# Patient Record
Sex: Female | Born: 1985 | Race: White | Hispanic: No | Marital: Single | Smoking: Current every day smoker
Health system: Southern US, Community
[De-identification: ages and names within clinical notes are randomized; demographics above are authoritative.]

---
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
## 2006-01-17 ENCOUNTER — Emergency Department (HOSPITAL_COMMUNITY): Admission: EM | Admit: 2006-01-17 | Discharge: 2006-01-17 | Payer: Self-pay

## 2006-01-23 ENCOUNTER — Emergency Department (HOSPITAL_COMMUNITY): Admission: EM | Admit: 2006-01-23 | Discharge: 2006-01-23 | Payer: Self-pay | Admitting: Emergency Medicine

## 2006-01-30 ENCOUNTER — Emergency Department (HOSPITAL_COMMUNITY): Admission: EM | Admit: 2006-01-30 | Discharge: 2006-01-30 | Payer: Self-pay | Admitting: Emergency Medicine

## 2006-02-11 ENCOUNTER — Emergency Department (HOSPITAL_COMMUNITY): Admission: EM | Admit: 2006-02-11 | Discharge: 2006-02-11 | Payer: Self-pay | Admitting: Emergency Medicine

## 2006-04-07 ENCOUNTER — Emergency Department (HOSPITAL_COMMUNITY): Admission: EM | Admit: 2006-04-07 | Discharge: 2006-04-07 | Payer: Self-pay | Admitting: Emergency Medicine

## 2006-04-19 ENCOUNTER — Emergency Department (HOSPITAL_COMMUNITY): Admission: EM | Admit: 2006-04-19 | Discharge: 2006-04-19 | Payer: Self-pay | Admitting: Emergency Medicine

## 2006-04-20 ENCOUNTER — Emergency Department (HOSPITAL_COMMUNITY): Admission: EM | Admit: 2006-04-20 | Discharge: 2006-04-20 | Payer: Self-pay | Admitting: Emergency Medicine

## 2006-07-09 ENCOUNTER — Emergency Department (HOSPITAL_COMMUNITY): Admission: EM | Admit: 2006-07-09 | Discharge: 2006-07-09 | Payer: Self-pay | Admitting: *Deleted

## 2006-08-16 ENCOUNTER — Emergency Department (HOSPITAL_COMMUNITY): Admission: EM | Admit: 2006-08-16 | Discharge: 2006-08-16 | Payer: Self-pay | Admitting: Emergency Medicine

## 2006-08-25 ENCOUNTER — Emergency Department (HOSPITAL_COMMUNITY): Admission: EM | Admit: 2006-08-25 | Discharge: 2006-08-25 | Payer: Self-pay | Admitting: Emergency Medicine

## 2006-12-03 ENCOUNTER — Inpatient Hospital Stay (HOSPITAL_COMMUNITY): Admission: AD | Admit: 2006-12-03 | Discharge: 2006-12-03 | Payer: Self-pay | Admitting: Obstetrics & Gynecology

## 2006-12-20 ENCOUNTER — Ambulatory Visit: Payer: Self-pay | Admitting: Obstetrics and Gynecology

## 2007-01-22 ENCOUNTER — Emergency Department (HOSPITAL_COMMUNITY): Admission: EM | Admit: 2007-01-22 | Discharge: 2007-01-22 | Payer: Self-pay | Admitting: *Deleted

## 2007-01-23 ENCOUNTER — Inpatient Hospital Stay (HOSPITAL_COMMUNITY): Admission: AD | Admit: 2007-01-23 | Discharge: 2007-01-23 | Payer: Self-pay | Admitting: Gynecology

## 2007-01-25 ENCOUNTER — Inpatient Hospital Stay (HOSPITAL_COMMUNITY): Admission: AD | Admit: 2007-01-25 | Discharge: 2007-01-25 | Payer: Self-pay | Admitting: Obstetrics & Gynecology

## 2007-01-28 ENCOUNTER — Emergency Department (HOSPITAL_COMMUNITY): Admission: EM | Admit: 2007-01-28 | Discharge: 2007-01-28 | Payer: Self-pay | Admitting: Emergency Medicine

## 2007-02-25 ENCOUNTER — Emergency Department (HOSPITAL_COMMUNITY): Admission: EM | Admit: 2007-02-25 | Discharge: 2007-02-25 | Payer: Self-pay | Admitting: Emergency Medicine

## 2007-03-07 ENCOUNTER — Emergency Department (HOSPITAL_COMMUNITY): Admission: EM | Admit: 2007-03-07 | Discharge: 2007-03-07 | Payer: Self-pay | Admitting: *Deleted

## 2007-03-07 ENCOUNTER — Ambulatory Visit: Payer: Self-pay | Admitting: Obstetrics & Gynecology

## 2007-04-26 ENCOUNTER — Ambulatory Visit: Payer: Self-pay | Admitting: Gynecology

## 2007-05-22 ENCOUNTER — Ambulatory Visit (HOSPITAL_COMMUNITY): Admission: RE | Admit: 2007-05-22 | Discharge: 2007-05-22 | Payer: Self-pay | Admitting: Gynecology

## 2007-05-22 ENCOUNTER — Ambulatory Visit: Payer: Self-pay | Admitting: Gynecology

## 2007-05-23 ENCOUNTER — Inpatient Hospital Stay (HOSPITAL_COMMUNITY): Admission: RE | Admit: 2007-05-23 | Discharge: 2007-05-23 | Payer: Self-pay | Admitting: Obstetrics & Gynecology

## 2007-05-23 ENCOUNTER — Ambulatory Visit: Payer: Self-pay | Admitting: Physician Assistant

## 2007-06-13 ENCOUNTER — Ambulatory Visit: Payer: Self-pay | Admitting: Obstetrics & Gynecology

## 2007-07-30 ENCOUNTER — Emergency Department (HOSPITAL_COMMUNITY): Admission: EM | Admit: 2007-07-30 | Discharge: 2007-07-30 | Payer: Self-pay | Admitting: Emergency Medicine

## 2007-08-02 ENCOUNTER — Ambulatory Visit: Payer: Self-pay | Admitting: Obstetrics & Gynecology

## 2007-10-07 ENCOUNTER — Emergency Department (HOSPITAL_COMMUNITY): Admission: EM | Admit: 2007-10-07 | Discharge: 2007-10-07 | Payer: Self-pay | Admitting: Emergency Medicine

## 2007-10-15 ENCOUNTER — Inpatient Hospital Stay (HOSPITAL_COMMUNITY): Admission: RE | Admit: 2007-10-15 | Discharge: 2007-10-15 | Payer: Self-pay | Admitting: Family Medicine

## 2007-10-15 ENCOUNTER — Ambulatory Visit: Payer: Self-pay | Admitting: Family Medicine

## 2007-11-14 ENCOUNTER — Inpatient Hospital Stay (HOSPITAL_COMMUNITY): Admission: AD | Admit: 2007-11-14 | Discharge: 2007-11-14 | Payer: Self-pay | Admitting: Obstetrics and Gynecology

## 2008-01-07 ENCOUNTER — Emergency Department (HOSPITAL_COMMUNITY): Admission: EM | Admit: 2008-01-07 | Discharge: 2008-01-07 | Payer: Self-pay | Admitting: Emergency Medicine

## 2008-02-17 ENCOUNTER — Inpatient Hospital Stay (HOSPITAL_COMMUNITY): Admission: AD | Admit: 2008-02-17 | Discharge: 2008-02-17 | Payer: Self-pay | Admitting: Obstetrics and Gynecology

## 2008-06-01 ENCOUNTER — Inpatient Hospital Stay (HOSPITAL_COMMUNITY): Admission: AD | Admit: 2008-06-01 | Discharge: 2008-06-01 | Payer: Self-pay | Admitting: Obstetrics & Gynecology

## 2008-08-17 ENCOUNTER — Emergency Department (HOSPITAL_COMMUNITY): Admission: EM | Admit: 2008-08-17 | Discharge: 2008-08-18 | Payer: Self-pay | Admitting: Emergency Medicine

## 2008-09-04 ENCOUNTER — Ambulatory Visit: Payer: Self-pay | Admitting: Obstetrics and Gynecology
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
## 2009-01-11 ENCOUNTER — Inpatient Hospital Stay (HOSPITAL_COMMUNITY): Admission: AD | Admit: 2009-01-11 | Discharge: 2009-01-11 | Payer: Self-pay | Admitting: Obstetrics & Gynecology

## 2010-04-11 ENCOUNTER — Encounter: Payer: Self-pay | Admitting: Pediatrics

## 2010-06-24 LAB — WET PREP, GENITAL
Trich, Wet Prep: NONE SEEN
Yeast Wet Prep HPF POC: NONE SEEN

## 2010-06-24 LAB — POCT PREGNANCY, URINE: Preg Test, Ur: NEGATIVE

## 2010-06-29 LAB — RAPID URINE DRUG SCREEN, HOSP PERFORMED
Cocaine: NOT DETECTED
Opiates: NOT DETECTED

## 2010-06-29 LAB — POCT I-STAT, CHEM 8
BUN: 9 mg/dL (ref 6–23)
Chloride: 108 mEq/L (ref 96–112)
Creatinine, Ser: 0.9 mg/dL (ref 0.4–1.2)
Glucose, Bld: 95 mg/dL (ref 70–99)
Hemoglobin: 13.9 g/dL (ref 12.0–15.0)
Potassium: 3.5 mEq/L (ref 3.5–5.1)
Sodium: 140 mEq/L (ref 135–145)

## 2010-06-29 LAB — DIFFERENTIAL
Eosinophils Absolute: 0.1 10*3/uL (ref 0.0–0.7)
Lymphocytes Relative: 27 % (ref 12–46)
Lymphs Abs: 1.9 10*3/uL (ref 0.7–4.0)
Monocytes Relative: 5 % (ref 3–12)
Neutrophils Relative %: 66 % (ref 43–77)

## 2010-06-29 LAB — URINALYSIS, ROUTINE W REFLEX MICROSCOPIC
Bilirubin Urine: NEGATIVE
Glucose, UA: NEGATIVE mg/dL
Ketones, ur: 15 mg/dL — AB
Specific Gravity, Urine: 1.02 (ref 1.005–1.030)
pH: 7.5 (ref 5.0–8.0)

## 2010-06-29 LAB — ACETAMINOPHEN LEVEL: Acetaminophen (Tylenol), Serum: <10 ug/mL — ABNORMAL LOW (ref 10–30)

## 2010-06-29 LAB — POCT PREGNANCY, URINE: Preg Test, Ur: NEGATIVE

## 2010-06-29 LAB — CBC
HCT: 38.9 % (ref 36.0–46.0)
MCV: 90.7 fL (ref 78.0–100.0)
Platelets: 229 10*3/uL (ref 150–400)
RBC: 4.29 MIL/uL (ref 3.87–5.11)
WBC: 6.9 10*3/uL (ref 4.0–10.5)

## 2010-06-29 LAB — URINE MICROSCOPIC-ADD ON

## 2010-06-29 LAB — ETHANOL: Alcohol, Ethyl (B): <5 mg/dL (ref 0–10)

## 2010-07-01 LAB — WET PREP, GENITAL
Clue Cells Wet Prep HPF POC: NONE SEEN
Trich, Wet Prep: NONE SEEN

## 2010-07-01 LAB — GC/CHLAMYDIA PROBE AMP, GENITAL
Chlamydia, DNA Probe: NEGATIVE
GC Probe Amp, Genital: NEGATIVE

## 2010-08-03 NOTE — Op Note (Signed)
Veronica Brewer, Veronica Brewer NO.:  [id number]   MEDICAL RECORD NO.:  [id number]          PATIENT TYPE:  AMB   LOCATION:  SDC                           [facility]  [facility]   PHYSICIAN:  Ginger Carne, MD  DATE OF BIRTH:  06-01-1985   DATE OF PROCEDURE:  05/22/2007  DATE OF DISCHARGE:                               OPERATIVE REPORT   PREOPERATIVE DIAGNOSIS:  Chronic pelvic pain.   POSTOPERATIVE DIAGNOSIS:  Stage I endometriosis.   PROCEDURE:  Laparoscopic cauterization of pelvic endometriosis.   SURGEON:  Ginger Carne, M.D.   ASSISTANT:  None.   COMPLICATIONS:  None immediate.   ESTIMATED BLOOD LOSS:  Minimal.   SPECIMENS:  None.   OPERATIVE FINDINGS:  External genitalia, vulva and vagina normal.  Cervix smooth without erosions or lesions.  Laparoscopic evaluation  demonstrated evidence of stage I endometriosis with multiple red  punctate lesions in the broad ligaments, the left and right uterosacral  ligaments, and the surface of the left ovary.  Both tubes appeared  normal.  There were also clear punctate lesions bilaterally on the broad  ligaments.  Uterus was normal size and contour.  Appendix was normal.  No evidence of femoral, inguinal, or obturator hernias.  No evidence of  adhesive disease in the pelvis.  No evidence of Fitz-Hugh-Curtis  syndrome.  Large and small bowel grossly normal.  Anterior bladder flap  normal.   DESCRIPTION OF PROCEDURE:  The patient was prepped and draped in the  usual sterile fashion and placed in the lithotomy position.  Betadine  solution used for antiseptic and the patient was catheterized prior to  the procedure.  After adequate general anesthesia, the tenaculum was  placed on the anterior lip of the cervix and a Hickling tenaculum placed  in the endocervical canal.  Afterward a vertical infraumbilical incision  was made and the Veress needle placed in the abdomen.  Opening and  closing pressures were 10-15 mmHg.   Needle released, trocar placed  through same incision, and laparoscope placed through the trocar sleeve.  A 5 mm port was made in the left lower quadrant under direct  visualization.  Inspection of the pelvic contents was carried out.  Following this cauterization with bipolar cautery at 40 watts along the  inner aspect of the uterosacral ligaments with the ureters in view  bilaterally was performed.  Areas in the cul-de-sac were also  cauterized.  The remainder could not be done due to concerns regarding  the ureters position.  The lesion on the surface of the left ovary was  also cauterized.  Afterward  gas was released and trocars removed.  Closure of the 10 mm fascia site  with 0 Vicryl suture and 4-0 Vicryl for subcuticular closure.  Needle,  sponge, and instrument counts correct.  The patient tolerated the  procedure well and returned to the postanesthesia care unit in excellent  condition.      Ginger Carne, MD  Electronically Signed     SHB/MEDQ  D:  05/22/2007  T:  05/22/2007  Job:  [id number]

## 2010-08-03 NOTE — Group Therapy Note (Signed)
NAME:  REID, NAWROT NO.:  [id number]   MEDICAL RECORD NO.:  [id number]          PATIENT TYPE:  WOC   LOCATION:  [facility]                   FACILITY:  [facility]   PHYSICIAN:  Argentina Donovan, MD        DATE OF BIRTH:  1986/02/05   DATE OF SERVICE:                                  CLINIC NOTE   The patient is a 25 year old Caucasian female nulligravida who has been  seen multiple times in multiple emergency rooms for midcycle pain,  ruptured ovarian cysts.  She complains of chronic pain all the time on  and off, although she did well when she was on Provera the first time.  She is very bad with taking pills she says, but she bled all the second  time she was on it, but since she has been off she has had more pain.  I  have offered that the ultrasound was normal except for some two small  follicular cysts.  I have told her we could give her another try on  that.  I have told her the options of the patch and the IUD and the  ring.  She seems to want to try the Depo-Provera again.  I told her if  she starts bleeding we can give it every two months instead of every  three and see if that takes care of her.  She is a slender young lady.  Has not had any problem with weight gain when she was on it.  She weighs  107 pounds, 5 feet 5 inches, and works at the [employer] at [employer].  Her Pap smear last was at the beginning of this year, and she will not  be due  for another one until after the first of the year.  Her  examination has been generally normal.  In the MAU visit in here we  discussed it was not necessary to put her through that again.  All her  labs have been fine.   IMPRESSION:  1. Dysmenorrhea.  2. Recurrent ovarian cysts.           ______________________________  Argentina Donovan, MD     PR/MEDQ  D:  12/20/2006  T:  12/21/2006  Job:  [id number]

## 2010-08-03 NOTE — Group Therapy Note (Signed)
NAME:  Veronica Brewer, Veronica Brewer NO.:  [id number]   MEDICAL RECORD NO.:  [id number]          PATIENT TYPE:  WOC   LOCATION:  [facility]                   FACILITY:  [facility]   PHYSICIAN:  Elsie Lincoln, MD      DATE OF BIRTH:  [birth year]   DATE OF SERVICE:                                  CLINIC NOTE   The patient is a 25 year old female who had surgery with Dr. Mia Creek  approximately 2 weeks ago.  On May 22, 2007, the patient had chronic  pelvic pain and was found to have Stage I endometriosis.  He did some  cauterizing of the implants, but was not able to get all of it per the  patient.  Postoperatively the patient continues to have pelvic pain  which she states is severe.  She takes occasional Vicodin but it causes  her nausea.  She wants to get pregnant so she is a  little leery to go  on birth control.  However, she has no insurance and is in a same sex  couple, so I told her that she should probably obtain insurance and  maybe go through IUI once she has enough money and a stable home  situation to support a child.  She did agree to go on Yaz continuous so  we gave her 2 packs and we will have her come back in 3 months to see  how she is doing on that.  A  prescription for Dianah Field was also given.  She  was also given a prescription for Tramadol for pain.  Post surgically,  her incisions are well healed and I did cut portions of the knots out.  The patient is coming back in 3 months for reevaluation.           ______________________________  Elsie Lincoln, MD     KL/MEDQ  D:  06/13/2007  T:  06/13/2007  Job:  [id number]

## 2010-08-06 NOTE — H&P (Signed)
NAMELOGHAN, SUBIA                ACCOUNT NO.:  [id number]   MEDICAL RECORD NO.:  [id number]          PATIENT TYPE:  INP   LOCATION:  0152                         FACILITY:  [facility]   PHYSICIAN:  Melissa L. Ladona Ridgel, MD  DATE OF BIRTH:  [birth year]   DATE OF ADMISSION:  03/04/2005  DATE OF DISCHARGE:                                HISTORY & PHYSICAL   CHIEF COMPLAINT:  Body aches, headache, urinary tract infection.   PRIMARY CARE PHYSICIAN:  Unassigned.   HISTORY OF PRESENT ILLNESS:  The patient is an 25 year old white female who  states that Wednesday night she worked up acutely ill with body aches,  burning up with fever, shivering.  She says she stayed in bed and awoke in  the morning and could not breath, so she had her friend bring her to the  emergency room at [facility].  In the emergency room, she was  evaluated and treated with IV fluids, pain medications, and antibiotics.  She was discharged to home with a prescription for pain medication, nausea  medication, Pyridium, and oral Cipro.  The patient states that today she  took one dose of each but still continued to have dizziness, headache.  Her  friends reported that she was acting strangely and may have had some passing  out spells.  The friends brought her to the emergency room for further  evaluation and we have been asked to admit her for a urinary tract infection  with likely sepsis.   PAST MEDICAL HISTORY:  1.  Asthma.  She has never been intubated for this and controls her disease      only with p.r.n. albuterol.  2.  Panic attacks, diagnosed three weeks ago after she presented to the      urgent care center.  She was started on Paxil at that time and was to      followup with an appointment with [facility].   PAST SURGICAL HISTORY:  Tonsillectomy.   SOCIAL HISTORY:  She denies tobacco, ethanol.  Currently she is unemployed  and she denies illicit drug use.   FAMILY HISTORY:  Dad  is living with renal problems of unclear etiology.  Mom  is living with unknown medical illness.  Her grandmother is whom she stays  with and she has diabetes.   REVIEW OF SYSTEMS:  She did have a little bit of diarrhea, positive nausea  but no vomiting.  She has not been eating very well.  She has been dizzy.  She has a headache.  She continues to have body aches and chills.  She  states that she does have some dysuria.  All other review of systems are  negative with the exception of some mild back pain which has come and gone.   ALLERGIES:  CODEINE.   MEDICATIONS:  1.  Albuterol p.r.n.  2.  Paxil, she takes the pink one, milligrams are unknown.   PHYSICAL EXAMINATION:  VITAL SIGNS:  Temperature is 101.4, blood pressure  82/84, pulse was 128, respirations 18, saturation 99%.  GENERAL:  This is  a very ill-appearing white female.  HEENT:  She is normocephalic, atraumatic.  Pupils are equal, round, and  reactive to light.  Extraocular muscles are intact.  Her mucous membranes  are dry with dry perioral skin and lips.  Her left eye is puffy but she has  no conjunctivitis.  NECK:  Supple.  There is no JVD.  CHEST:  Decreased breath sounds bilaterally.  No rhonchi, rales, or wheezes.  Her breath sounds are distant.  CARDIOVASCULAR:  Tachycardic.  Positive S1 S2.  No S3, S4.  She has very  distant heart sounds.  ABDOMEN:  Soft, mildly tender in the right flank.  No guarding or rebound.  No hepatosplenomegaly.  EXTREMITIES:  There is no clubbing, cyanosis, or edema.  NEUROLOGIC:  She is awake, alert, and oriented x3.  Cranial nerves II-XII  appear to be intact.  She is obviously fatigued.  Power is 4/5 bilaterally  and symmetrical.   LABORATORY:  Reveal a urinalysis with large leukocyte esterase, positive  nitrites, greater than 30 of total protein, positive ketones.  Her white  count is 21.2, hemoglobin 12.3, hematocrit 35.6, platelets 150.  Her sodium  is 133, potassium 3.4, chloride  is 107, CO2 is 21, BUN is 10, creatinine is  12, glucose is 124, calcium is 7.  Urine pregnancy is negative.   ASSESSMENT:  This is an 25 year old white female seen last evening at the  [facility] Emergency Room for shortness of breath, chills, weakness,  diagnosed with a urinary tract infection and sent home with oral  medications.  The patient returns with continued chills, fever, weakness,  possible syncopal episode.  She will be admitted to the stepdown for  treatment of a urinary tract infection and sepsis with hypotension and  tachycardia.   PLAN:  1.  GU.  UTI probable sepsis.  We will aggressively volume replete her.      Continue her on IV Cipro.  Followup on culture from last evening and      have some blood cultures and urine cultures again today.  If she remains      volume unresponsive, then we will need to be more aggressive with      possible pressors.  If she deteriorates, I will change her antibiotics      to a more aggressive coverage.  I would like to check a renal ultrasound      tonight to rule out possible hydronephrosis and place a Foley for      monitoring of her urine output to prevent her from synopsizing from      orthostasis when rising.  We will continue aggressive volume      resuscitation.  2.  GI.  Positive nausea.  We will symptomatically treat her with Zofran,      and we will start her on Protonix.  3.  Endocrine.  She has got mild hyperglycemia.  We will monitor her CBGs      q.6h. with modified sliding scale insulin for coverage only greater than      in the 200s.  4.  Cardiovascular.  Tachycardia.  She has only received 1 liter of fluids.      If she remains unresponsive to volume repletion with regard to her blood      pressure or her urine output decreases, we may need to add pressors.  5.  Pulmonary.  She has a history of asthma.  Yesterday she was short of      breath.  I will check a chest x-ray.  I will continue her albuterol     p.r.n. and  add a flutter valve.  6.  DVT prophylaxis will be with Lovenox 40 mg subcutaneously every day.  7.  Hypocalcemia.  I will check an albumin and calculate a corrected      calcium.  If it is low, we will check an ionized calcium and replete.  8.  Neurology.  She has a history of passing out.  This is probably      secondary to orthostasis.  I would like to keep her in bed until she is      volume replenished; therefore, a Foley will be placed.  At this time,      she is neurologically appropriate,.  9.  ID.  We will follow up her blood cultures, urine cultures, and adjust      the antibiotics according to her re-evaluation.  For now, I will      continue the Cipro.  But if she deteriorates, we will add more      aggressive antibiotic      therapy.  10. Psych.  For her panic attacks, for now hold the Paxil.  We will try to      identify the dosage.  The patient will need appropriate outpatient      followup since being started recently on this Paxil.      Melissa L. Ladona Ridgel, MD  Electronically Signed     MLT/MEDQ  D:  03/04/2005  T:  03/04/2005  Job:  [id number]

## 2010-08-06 NOTE — Discharge Summary (Signed)
Veronica Brewer, Veronica Brewer NO.:  [id number]   MEDICAL RECORD NO.:  [id number]          PATIENT TYPE:  INP   LOCATION:  0152                         FACILITY:  [facility]   PHYSICIAN:  Theone Stanley, MD   DATE OF BIRTH:  29-Sep-1985   DATE OF ADMISSION:  03/04/2005  DATE OF DISCHARGE:  03/07/2005                                 DISCHARGE SUMMARY   ADMISSION DIAGNOSES:  1.  Urinary tract infection with sepsis.  2.  History of asthma.  3.  Anxiety disorder.   DISCHARGE DIAGNOSES:  1.  Urinary tract infection with sepsis.  2.  History of asthma.  3.  Anxiety disorder.  4.  Herpes labialis.   CONSULTATIONS:  Critical Care Management.   PROCEDURES AND DIAGNOSTIC TESTS:  None.   HOSPITAL COURSE:  [name] is a very anxious 25 year old female who  presented with continued problems with not feeling well overall for the past  week. She had recently been in the ER and she was discharged on Cipro since  it was found that she had a urinary tract infection. However, because she  continued to feel bad, she returned to the ER and at that time it was noted  that her pressures were in the 80s in the 70s. She was admitted to the ICU  at that point in time. Critical Care was consulted and the patient had  refused a central line. She received several boluses, her pressures came up.  She was placed on IV Cipro, she improved on a daily basis and in addition  since there is a history of kidney disease in the family, a 24 hour urine  was obtained. These results are still pending. During her stay, it was noted  that she had herpes labialis, she was given Valtrex on top of that. Also of  note, her labia majora was red. Examination of her vaginal area revealed  evidence of yeast infection. She was given a dose of Diflucan. In addition,  she had discharge so chlamydia and gonorrhea cultures were sent. The results  are pending. Overall, the patient improved over the next few days. Her  pressures came up into the 120s to 130s. Her heart rate came down into the  80s. She feeling well enough that she was able to ambulate. She was eating,  she was discharged in stable condition.   DISCHARGE MEDICATIONS:  1.  Ciprofloxacin 500 mg 1 p.o. b.i.d. for an additional 11 days.  2.  Paxil at her home dosage.  3.  She was given a small amount of Ativan until she sees her primary care      physician.   FOLLOW UP:  The patient is to followup with [facility].      Theone Stanley, MD  Electronically Signed     AEJ/MEDQ  D:  03/07/2005  T:  03/09/2005  Job:  [id number]   cc:   [facility]

## 2010-08-06 NOTE — Consult Note (Signed)
Veronica Brewer, POINTER                ACCOUNT NO.:  [id number]   MEDICAL RECORD NO.:  [id number]          PATIENT TYPE:  INP   LOCATION:  0152                         FACILITY:  [facility]   PHYSICIAN:  Coralyn Helling, M.D.      DATE OF BIRTH:  [birth year]   DATE OF CONSULTATION:  03/04/2005  DATE OF DISCHARGE:  03/07/2005                                   CONSULTATION   REFERRING PHYSICIAN:  Dr. Derenda Mis   REASON FOR CONSULTATION:  Rule out sepsis.   This is an 25 year old female, who was admitted earlier in the day after  developing diffuse body aches with fever and dysuria.  She says that her  symptoms started approximately 2 days ago when she woke up in the middle of  the night with fever and shivering and then the body aches.  She had come to  the emergency room actually initially back on December 10 with vomiting, and  she was given promethazine for this and discharged home.  She also was  complaining of some tingling feelings in her hand.  She came back to the  emergency room on December 14 for these complaints of fever and muscle ache.  She was given Cipro and Pyridium and discharged home; however, her symptoms  continued to progress.  Of note is that in prior documentations and previous  ER visits, her blood pressures have been in the 120s/80s.  Today her blood  pressure has been anywhere from 80-90 systolic, and she has had a sinus  tachycardia with a heart rate of 130-140.  She has had some headache but  denies neck stiffness.  There is no blurred vision.  She denies thyroid  disease.  She does have a history of asthma but only uses her inhalers  sparingly.  She denies any symptoms of chest pain or shortness of breath,  although she has had a cough with clear to yellow-colored sputum since  today.  She denies any skin rashes or joint tenderness or swelling.  She  says she thinks she has lost weight, although she is not sure how much.  She  denies abdominal pain, although she  complains of having pain in her side.  She denies having any diarrhea.  She did not think that she was pregnant  and, again, she has had a negative pregnancy test.   PAST MEDICAL HISTORY:  1.  Panic attacks.  2.  Asthma.   MEDICATIONS:  Cipro, Pyridium, Phenergan, Vicodin, and she had been on Paxil  previously.   PAST SURGICAL HISTORY:  tonsillectomy.   SOCIAL HISTORY:  She denies history of tobacco, alcohol, or illicit drug  use.  She is currently unemployed, and she is no longer in school.   FAMILY HISTORY:  Significant for her father who has an unspecified kidney  disease.   REVIEW OF SYSTEMS:  Please see above.   PHYSICAL EXAMINATION:  GENERAL:  She was seen in the intensive care unit.  She appears to be somewhat anxious and nervous, although she is otherwise  alert and oriented.  VITAL SIGNS:  Her temperature  currently is 100.7, although it has been as  high as 101.4.  Blood pressure currently is 90/30.  Heart rate is 135 and  regular.  Oxygen saturation is 100% on room air.  Respiratory rate is 12.  HEENT:  Pupils equal and reactive.  Extraocular muscles intact.  There is no  scleral icterus.  There is no sinus tenderness, no nasal discharge nor  lesions.  She does have a tongue ring.  NECK:  There is no lymphadenopathy, no thyromegaly.  HEART:  S1, S2, tachycardic.  CHEST:  There is no wheezing or rales.  ABDOMEN:  Soft.  There is some mild tenderness in the right flank but no  rebound or guarding.  She has positive bowel sounds.  EXTREMITIES:  There is no edema, cyanosis, or clubbing.  NEUROLOGIC EXAM:  She is alert and oriented x 3.  She had 5/5 strength.   LABORATORY VALUE:  Arterial blood gas analysis on room air showed a pH of  7.40, a pCO2 of 27, pO2 85.7.  Bilirubin is 1.4, alkaline phosphatase 56,  AST 34, ALT 74, total protein 4.3, albumin 2.1.  Urine microscopy showed 21-  50 WBCs, 11-20 RBCs, with a few bacteria.  Sodium is 133, potassium 3.4,  chloride  107, CO2 21, BUN 10, creatinine 1.2, glucose 124, calcium 7.  CBC:  Hemoglobin is 12.3, hematocrit 35.6, WBC 21.2, platelet count 150 with 20%  bands.  Blood culture and urine culture are pending.  Urine hCG is negative.  Urinalysis showed specific gravity of 1.045, urine pH 5, urine glucose 100,  protein greater than 300, nitrites were positive, and leukocytes were large.  Chest x-ray was essentially normal except for a faint possible infiltrate in  the left base.   IMPRESSION:  1.  Hypertension with likely a urinary source of infection and mildly      elevated creatinine.  Given her body habitus and muscle mass with some      degree of proteinuria in addition to having a low albumin and serum      protein, I agree with previous consultation at this time.  I agree with      continuation of antibiotics.  I would repeat her chemistry as well as      her total protein and albumin level, follow up on other electrolytes,      follow up on culture results.  If her renal function continues to      deteriorate, nephrology consultation may be warranted.  I do not feel      that she needs to be placed on the sepsis protocol at this time.  I      have, however, discussed with her the possible need for central venous      catheterization for monitoring of her central venous pressures as well      as if the need should arise for more aggressive fluid resuscitation as      well as present medications.  She is quite adamant about not wanting to      have any catheters inserted at this time.  I will therefore monitor her      with her blood pressure and heart rate, urine output, and continue      aggressive IV fluid resuscitation, and then I would also follow up with      a thyroid function evaluation and cortisol level.  Human      immunodeficiency virus test may also be warranted, and then further  recommendations would follow upon review of her response to these.      Coralyn Helling, M.D.   Electronically Signed     VS/MEDQ  D:  03/04/2005  T:  03/07/2005  Job:  [id number]

## 2010-12-13 LAB — URINE CULTURE: Colony Count: >=100000

## 2010-12-13 LAB — CBC
MCV: 89
Platelets: 200
RBC: 4.98
WBC: 6.2

## 2010-12-13 LAB — BASIC METABOLIC PANEL
BUN: 10
Calcium: 9.4
Chloride: 103
Creatinine, Ser: 0.79
GFR calc Af Amer: >60
GFR calc non Af Amer: >60

## 2010-12-13 LAB — URINALYSIS, ROUTINE W REFLEX MICROSCOPIC
Bilirubin Urine: NEGATIVE
Leukocytes, UA: NEGATIVE
Nitrite: NEGATIVE
Specific Gravity, Urine: 1.015
Urobilinogen, UA: 0.2
pH: 6

## 2010-12-13 LAB — HCG, SERUM, QUALITATIVE: Preg, Serum: NEGATIVE

## 2010-12-13 LAB — URINE MICROSCOPIC-ADD ON

## 2010-12-17 LAB — COMPREHENSIVE METABOLIC PANEL
ALT: 13
AST: 15
Albumin: 4
Alkaline Phosphatase: 55
CO2: 26
Chloride: 103
GFR calc Af Amer: >60
Potassium: 3.9
Total Bilirubin: 0.5

## 2010-12-17 LAB — CBC
HCT: 44.5
HCT: 40.8
Hemoglobin: 13.6
MCHC: 33.3
MCV: 90.2
Platelets: 290
Platelets: 164
RBC: 4.97
RBC: 4.52
RDW: 12.8
WBC: 8.7
WBC: 9.8

## 2010-12-17 LAB — MONONUCLEOSIS SCREEN: Mono Screen: NEGATIVE

## 2010-12-17 LAB — DIFFERENTIAL
Basophils Absolute: 0
Basophils Relative: 1
Eosinophils Absolute: 0.1
Eosinophils Relative: 1
Lymphocytes Relative: 15
Lymphs Abs: 1.5
Monocytes Absolute: 1
Monocytes Relative: 10
Neutro Abs: 7.2
Neutrophils Relative %: 74

## 2010-12-17 LAB — BASIC METABOLIC PANEL
BUN: 4 — ABNORMAL LOW
Calcium: 9.2
Creatinine, Ser: 0.67
GFR calc non Af Amer: >60
Glucose, Bld: 77

## 2010-12-17 LAB — RAPID STREP SCREEN (MED CTR MEBANE ONLY): Streptococcus, Group A Screen (Direct): NEGATIVE

## 2010-12-21 LAB — WET PREP, GENITAL
Trich, Wet Prep: NONE SEEN
Yeast Wet Prep HPF POC: NONE SEEN

## 2010-12-21 LAB — URINALYSIS, ROUTINE W REFLEX MICROSCOPIC
Bilirubin Urine: NEGATIVE
Hgb urine dipstick: NEGATIVE
Ketones, ur: NEGATIVE
Specific Gravity, Urine: 1.025
Urobilinogen, UA: 0.2
pH: 6.5

## 2010-12-24 LAB — WOUND CULTURE: Gram Stain: NONE SEEN

## 2010-12-27 LAB — URINALYSIS, ROUTINE W REFLEX MICROSCOPIC
Glucose, UA: NEGATIVE
Hgb urine dipstick: NEGATIVE
pH: 5.5

## 2010-12-28 LAB — WET PREP, GENITAL: Trich, Wet Prep: NONE SEEN

## 2010-12-28 LAB — URINE MICROSCOPIC-ADD ON

## 2010-12-28 LAB — CBC
Hemoglobin: 14.2
Hemoglobin: 14.9
MCHC: 34.2
RBC: 4.57
RBC: 4.85
RDW: 12.1
WBC: 8.7

## 2010-12-28 LAB — URINALYSIS, ROUTINE W REFLEX MICROSCOPIC
Leukocytes, UA: NEGATIVE
Nitrite: NEGATIVE
Specific Gravity, Urine: 1.025
pH: 6

## 2010-12-28 LAB — POCT PREGNANCY, URINE
Operator id: 134651
Preg Test, Ur: NEGATIVE

## 2010-12-31 LAB — URINALYSIS, ROUTINE W REFLEX MICROSCOPIC
Glucose, UA: NEGATIVE
Protein, ur: NEGATIVE
Specific Gravity, Urine: >1.03 — ABNORMAL HIGH

## 2010-12-31 LAB — POCT PREGNANCY, URINE: Operator id: 117411

## 2010-12-31 LAB — WET PREP, GENITAL: Yeast Wet Prep HPF POC: NONE SEEN

## 2010-12-31 LAB — URINE MICROSCOPIC-ADD ON

## 2016-05-10 ENCOUNTER — Encounter (HOSPITAL_BASED_OUTPATIENT_CLINIC_OR_DEPARTMENT_OTHER): Payer: Self-pay | Admitting: *Deleted

## 2016-05-10 ENCOUNTER — Emergency Department (HOSPITAL_BASED_OUTPATIENT_CLINIC_OR_DEPARTMENT_OTHER)
Admission: EM | Admit: 2016-05-10 | Discharge: 2016-05-10 | Disposition: A | Discharge status: Home / Self Care | Payer: Medicaid Other | Attending: Emergency Medicine | Admitting: Emergency Medicine

## 2016-05-10 DIAGNOSIS — K0381 Cracked tooth: Secondary | ICD-10-CM | POA: Diagnosis not present

## 2016-05-10 DIAGNOSIS — Z791 Long term (current) use of non-steroidal anti-inflammatories (NSAID): Secondary | ICD-10-CM | POA: Insufficient documentation

## 2016-05-10 DIAGNOSIS — F17200 Nicotine dependence, unspecified, uncomplicated: Secondary | ICD-10-CM | POA: Diagnosis not present

## 2016-05-10 DIAGNOSIS — K0889 Other specified disorders of teeth and supporting structures: Secondary | ICD-10-CM

## 2016-05-10 MED ORDER — LIDOCAINE VISCOUS 2 % MT SOLN: 15.0000 mL | OROMUCOSAL | 1 refills | Status: AC | PRN

## 2016-05-10 MED ORDER — PENICILLIN V POTASSIUM 500 MG PO TABS: 500.0000 mg | ORAL_TABLET | Freq: Four times a day (QID) | ORAL | 0 refills | Status: AC

## 2016-05-10 MED ORDER — NAPROXEN 500 MG PO TABS: 500.0000 mg | ORAL_TABLET | Freq: Two times a day (BID) | ORAL | 0 refills | Status: AC

## 2016-05-10 MED ORDER — BUPIVACAINE-EPINEPHRINE (PF) 0.5% -1:200000 IJ SOLN
1.8000 mL | Freq: Once | INTRAMUSCULAR | Status: AC
  Administered 2016-05-10: 1.8 mL
  Filled 2016-05-10: qty 1.8

## 2016-05-10 MED FILL — NAPROXEN 500 MG TABLET: 500 | 15 days supply | Qty: 30 | Fill #0

## 2016-05-10 MED FILL — PENICILLIN VK 500 MG TABLET: 500 | 7 days supply | Qty: 28 | Fill #0

## 2016-05-10 MED FILL — LIDOCAINE 2% VISCOUS SOLN: 2 | 6 days supply | Qty: 100 | Fill #0

## 2016-05-10 NOTE — Discharge Instructions (Signed)
You have been seen today for dental pain. You should follow up with a dentist as soon as possible. This problem will not resolve on its own without the care of a dentist. Use ibuprofen or naproxen for pain. Use the viscous lidocaine for mouth pain. Swish with the lidocaine and spit it out. Do not swallow it. ° °Dental Resource Guide ° °[facility] °[address], [address] °[place], [place] [address] °[phone] ° °[facility] °[address] °[place], [place] [address] °[phone] ° °[facility] °[address] °[place], [place] [address] °[phone] ° °[facility] °[address] °[place], [place] [address] °[phone] ° °[facility] °[address] °[place], [place] [address] °[phone] ° °[employer] °Www.[facility].[employer].edu/patientcare/studentclinics/becomepatient ° °[employer] °[address] °[place], [place] [address] °[phone] ° °Website for free, low-income, or sliding scale dental services in [place]: °www.freedental.us ° °To find a dentist in [place] and surrounding areas: °www.ncdental.org/for-the-public/find-a-dentist ° °[employer] °http://www.ncdental.org/meetings-events/-[facility] ° ° Medicaid Dentist °https://dma.ncdhhs.gov/find-a-doctor/medicaid-dental-providers ° °

## 2016-05-10 NOTE — ED Notes (Signed)
ED Provider at bedside to inject gum.

## 2016-05-10 NOTE — ED Provider Notes (Signed)
[facility]   CSN: [id number] Arrival date & time: 05/10/16  1238     History   Chief Complaint Chief Complaint  Patient presents with  . Dental Pain    HPI Veronica Brewer is a 31 y.o. female.  HPI   Veronica Brewer is a 31 y.o. female, Patient with no pertinent past medical history, presenting to the ED with dental pain beginning 2 weeks ago, worsening or days ago. She complains of pain in the tooth in the lower right jaw. States a piece of the tooth broke off 2 weeks ago. She has an appointment with the dentist tomorrow. She has been taking ibuprofen with minimal relief. Denies fever/chills, nausea/vomiting, difficulty breathing or swallowing, trauma, or any other complaints.  History reviewed. No pertinent past medical history.  There are no active problems to display for this patient.   History reviewed. No pertinent surgical history.  OB History    No data available       Home Medications    Prior to Admission medications   Medication Sig Start Date End Date Taking? Authorizing Provider  ibuprofen (ADVIL,MOTRIN) 800 MG tablet Take 800 mg by mouth every 8 (eight) hours as needed.   Yes Historical Provider, MD  lidocaine (XYLOCAINE) 2 % solution Use as directed 15 mLs in the mouth or throat as needed for mouth pain. 05/10/16   Obdulio Mash C Cieara Stierwalt, PA-C  naproxen (NAPROSYN) 500 MG tablet Take 1 tablet (500 mg total) by mouth 2 (two) times daily. 05/10/16   Zafir Schauer C Hesper Venturella, PA-C  penicillin v potassium (VEETID) 500 MG tablet Take 1 tablet (500 mg total) by mouth 4 (four) times daily. 05/10/16 05/17/16  Anselm Pancoast, PA-C    Family History History reviewed. No pertinent family history.  Social History Social History  Substance Use Topics  . Smoking status: Current Every Day Smoker  . Smokeless tobacco: Never Used  . Alcohol use Not on file     Allergies   Patient has no known allergies.   Review of Systems Review of Systems  Constitutional:  Negative for chills and fever.  HENT: Positive for dental problem. Negative for facial swelling, trouble swallowing and voice change.   Respiratory: Negative for shortness of breath.   Gastrointestinal: Negative for nausea and vomiting.     Physical Exam Updated Vital Signs BP 131/75 (BP Location: Right Arm)   Pulse 102   Temp 98.8 F (37.1 C) (Oral)   Resp 22   Ht 5\' 5"  (1.651 m)   Wt 54.4 kg   LMP 04/04/2016   SpO2 100%   BMI 19.97 kg/m   Physical Exam  Constitutional: She appears well-developed and well-nourished. No distress.  HENT:  Head: Normocephalic and atraumatic.  First mandibular right-sided molar with a piece broken off. No noted swelling or fluctuance to the surrounding gingiva or buccal surface. No noted facial swelling. No trismus. Patient handles oral secretions without difficulty. Patient is able to open her mouth to at least three finger widths.  Eyes: Conjunctivae are normal.  Neck: Normal range of motion. Neck supple.  Cardiovascular: Normal rate and regular rhythm.   Pulmonary/Chest: Effort normal. No respiratory distress.  Lymphadenopathy:    She has no cervical adenopathy.  Neurological: She is alert.  Skin: Skin is warm and dry. She is not diaphoretic.  Psychiatric: She has a normal mood and affect. Her behavior is normal.  Nursing note and vitals reviewed.    ED Treatments / Results  Labs (all labs ordered are listed, but only abnormal results are displayed) Labs Reviewed - No data to display  EKG  EKG Interpretation None       Radiology No results found.  Procedures Dental Block Date/Time: 05/10/2016 3:54 PM Performed by: Anselm PancoastJOY, Bryah Ocheltree C Authorized by: Anselm PancoastJOY, Courtez Twaddle C   Consent:    Consent obtained:  Verbal   Consent given by:  Patient   Risks discussed:  Pain, swelling and unsuccessful block Indications:    Indications: dental pain   Location:    Block type:  Inferior alveolar   Laterality:  Right Procedure details (see MAR for  exact dosages):    Syringe type:  Controlled syringe   Needle gauge:  27 G   Anesthetic injected:  Bupivacaine 0.5% WITH epi   Injection procedure:  Anatomic landmarks identified, anatomic landmarks palpated, negative aspiration for blood, introduced needle and incremental injection Post-procedure details:    Outcome:  Pain relieved   Patient tolerance of procedure:  Tolerated well, no immediate complications    (including critical care time)  Medications Ordered in ED Medications  bupivacaine-epinephrine (MARCAINE W/ EPI) 0.5% -1:200000 injection 1.8 mL (1.8 mLs Infiltration Given 05/10/16 1602)     Initial Impression / Assessment and Plan / ED Course  I have reviewed the triage vital signs and the nursing notes.  Pertinent labs & imaging results that were available during my care of the patient were reviewed by me and considered in my medical decision making (see chart for details).     Patient presents with dental pain from a partial tooth fracture. Tolerated dental block well without immediate complication. Patient encouraged to keep her appointment with her dentist tomorrow. No signs of Ludwig's angioedema or sepsis. Further home care and return precautions discussed. Patient voices understanding of all instructions and is comfortable with discharge.  Vitals:   05/10/16 1253 05/10/16 1254 05/10/16 1602  BP:  131/75 123/76  Pulse:  102 105  Resp:  22 18  Temp:  98.8 F (37.1 C)   TempSrc:  Oral   SpO2:  100% 100%  Weight: 54.4 kg    Height: 5\' 5"  (1.651 m)     Patient was not tachycardic upon my evaluation.   Final Clinical Impressions(s) / ED Diagnoses   Final diagnoses:  Pain, dental    New Prescriptions Discharge Medication List as of 05/10/2016  4:11 PM    START taking these medications   Details  lidocaine (XYLOCAINE) 2 % solution Use as directed 15 mLs in the mouth or throat as needed for mouth pain., Starting Tue 05/10/2016, Print    naproxen (NAPROSYN)  500 MG tablet Take 1 tablet (500 mg total) by mouth 2 (two) times daily., Starting Tue 05/10/2016, Print    penicillin v potassium (VEETID) 500 MG tablet Take 1 tablet (500 mg total) by mouth 4 (four) times daily., Starting Tue 05/10/2016, Until Tue 05/17/2016, Print         Anselm PancoastShawn C Shawntez Dickison, PA-C 05/10/16 1628    Linwood DibblesJon Knapp, MD 05/12/16 2055

## 2016-05-10 NOTE — ED Triage Notes (Signed)
Pt reports right lower 2nd to last tooth broke off a few weeks ago, since Friday has had increased pain and swelling. Has apt with dentist tomorrow.
